# Patient Record
Sex: Female | Born: 1975 | Race: White | Hispanic: No | Marital: Married | Smoking: Never smoker
Health system: Southern US, Community
[De-identification: ages and names within clinical notes are randomized; demographics above are authoritative.]

## PROBLEM LIST (undated history)

## (undated) DIAGNOSIS — F32A Depression, unspecified: Secondary | ICD-10-CM

## (undated) DIAGNOSIS — F419 Anxiety disorder, unspecified: Secondary | ICD-10-CM

## (undated) HISTORY — PX: TYMPANOSTOMY TUBE PLACEMENT: SHX32

## (undated) HISTORY — PX: BREAST BIOPSY: SHX20

## (undated) HISTORY — DX: Depression, unspecified: F32.A

## (undated) HISTORY — DX: Anxiety disorder, unspecified: F41.9

---

## 2003-03-24 ENCOUNTER — Other Ambulatory Visit: Admission: RE | Admit: 2003-03-24 | Discharge: 2003-03-24 | Payer: Self-pay | Admitting: Family Medicine

## 2010-01-18 ENCOUNTER — Other Ambulatory Visit: Admission: RE | Admit: 2010-01-18 | Discharge: 2010-01-18 | Payer: Self-pay | Admitting: Obstetrics and Gynecology

## 2010-01-25 ENCOUNTER — Encounter: Admission: RE | Admit: 2010-01-25 | Discharge: 2010-01-25 | Payer: Self-pay | Admitting: Obstetrics and Gynecology

## 2010-07-06 ENCOUNTER — Encounter: Admission: RE | Admit: 2010-07-06 | Discharge: 2010-07-06 | Payer: Self-pay | Admitting: Obstetrics and Gynecology

## 2010-08-04 ENCOUNTER — Ambulatory Visit (HOSPITAL_COMMUNITY): Admission: RE | Admit: 2010-08-04 | Discharge: 2010-08-04 | Payer: Self-pay | Admitting: Family Medicine

## 2017-11-28 HISTORY — PX: BREAST BIOPSY: SHX20

## 2018-01-12 ENCOUNTER — Other Ambulatory Visit (HOSPITAL_COMMUNITY): Payer: Self-pay | Admitting: *Deleted

## 2018-01-12 DIAGNOSIS — N644 Mastodynia: Secondary | ICD-10-CM

## 2018-02-01 ENCOUNTER — Ambulatory Visit
Admission: RE | Admit: 2018-02-01 | Discharge: 2018-02-01 | Disposition: A | Discharge status: Home / Self Care | Payer: No Typology Code available for payment source | Source: Ambulatory Visit | Attending: Obstetrics and Gynecology | Admitting: Obstetrics and Gynecology

## 2018-02-01 ENCOUNTER — Other Ambulatory Visit (HOSPITAL_COMMUNITY): Payer: Self-pay | Admitting: Obstetrics and Gynecology

## 2018-02-01 ENCOUNTER — Ambulatory Visit (HOSPITAL_COMMUNITY)
Admission: RE | Admit: 2018-02-01 | Discharge: 2018-02-01 | Disposition: A | Discharge status: Home / Self Care | Payer: Self-pay | Source: Ambulatory Visit | Attending: Obstetrics and Gynecology | Admitting: Obstetrics and Gynecology

## 2018-02-01 ENCOUNTER — Encounter (HOSPITAL_COMMUNITY): Payer: Self-pay

## 2018-02-01 VITALS — BP 118/76 | Ht 67.0 in

## 2018-02-01 DIAGNOSIS — N644 Mastodynia: Secondary | ICD-10-CM

## 2018-02-01 DIAGNOSIS — Z01419 Encounter for gynecological examination (general) (routine) without abnormal findings: Secondary | ICD-10-CM

## 2018-02-01 DIAGNOSIS — N6310 Unspecified lump in the right breast, unspecified quadrant: Secondary | ICD-10-CM

## 2018-02-01 NOTE — Progress Notes (Signed)
Complaints of right outer breast pain since September 2018 that comes and goes. Patient rates the pain at a 4 out of 10.  Pap Smear: Pap smear completed today. Last Pap smear was in 2010 and normal per patient. Per patient has no history of an abnormal Pap smear. No Pap smear results are in Epic.  Physical exam: Breasts Breasts symmetrical. No skin abnormalities bilateral breasts. No nipple retraction bilateral breasts. No nipple discharge bilateral breasts. No lymphadenopathy. No lumps palpated bilateral breasts. Complaints of bilateral outer breast tenderness on exam that was greater within the left breast. [facility]. Appointment scheduled for Thursday, February 01, 2018 at 0850.        Pelvic/Bimanual   Ext Genitalia No lesions, no swelling and no discharge observed on external genitalia.         Vagina Vagina pink and normal texture. No lesions or discharge observed in vagina.          Cervix Cervix is present. Cervix pink and beefy red around cervical os. Cervix friable. Small amount of blood observed at cervical os. Patient stated it is about the time for her to start her menstrual cycle.     Uterus Uterus is present and palpable. Uterus in normal position and normal size.        Adnexae Bilateral ovaries present and palpable. No tenderness on palpation.         Rectovaginal No rectal exam completed today since patient had no rectal complaints. No skin abnormalities observed on exam.    Smoking History: Patient has never smoked.  Patient Navigation: Patient education provided. Access to services provided for patient through [facility] program.   Breast and Cervical Cancer Risk Assessment: Patient has a family history of her mother diagnosed with breast cancer. Patient has no known genetic mutations or history of radiation treatment to the chest before age 42. Patient has no history of cervical dysplasia,  immunocompromised, or DES exposure in-utero.

## 2018-02-01 NOTE — Patient Instructions (Addendum)
Explained breast self awareness with Christina Vaughn Ayuso. Let patient know [facility] will cover Pap smears and HPV typing every 5 years unless has a history of abnormal Pap smears. [facility]. Appointment scheduled for Thursday, February 01, 2018 at 0850. Let patient know will follow up with her within the next couple weeks with results of Pap smear by letter or phone. Christina Vaughn Fedele verbalized understanding.  Christina Vaughn, Kathaleen Maserhristine Poll, RN 9:38 AM

## 2018-02-02 ENCOUNTER — Encounter (HOSPITAL_COMMUNITY): Payer: Self-pay | Admitting: *Deleted

## 2018-02-02 LAB — CYTOLOGY - PAP
Diagnosis: NEGATIVE
HPV (WINDOPATH): NOT DETECTED

## 2018-02-05 ENCOUNTER — Ambulatory Visit
Admission: RE | Admit: 2018-02-05 | Discharge: 2018-02-05 | Disposition: A | Discharge status: Home / Self Care | Payer: No Typology Code available for payment source | Source: Ambulatory Visit | Attending: Obstetrics and Gynecology | Admitting: Obstetrics and Gynecology

## 2018-02-05 ENCOUNTER — Other Ambulatory Visit (HOSPITAL_COMMUNITY): Payer: Self-pay | Admitting: Obstetrics and Gynecology

## 2018-02-05 DIAGNOSIS — N6310 Unspecified lump in the right breast, unspecified quadrant: Secondary | ICD-10-CM

## 2018-02-12 ENCOUNTER — Ambulatory Visit: Payer: Self-pay | Admitting: Surgery

## 2018-02-12 DIAGNOSIS — N6310 Unspecified lump in the right breast, unspecified quadrant: Secondary | ICD-10-CM

## 2018-02-12 NOTE — H&P (Signed)
Christina Vaughn Nurse Documented: 02/12/2018 10:21 AM Location: [facility] Patient #: [id number] DOB: 25-Sep-1976 Married / Language: [place] / Race: White Female  History of Present Illness Christina Fus A. Walida Cajas MD; 02/12/2018 10:57 AM) Patient words: The patient sent at the request of Dr. Earlene Plater for evaluation of right breast pain in right breast mass 2. Patient gives a six-month history of right breast pain in the upper quadrant. The pain is related to her cycle and is more short before menses. The pain is become more constant and is described as dull and achy with radiation to her right shoulder. Nothing makes it better or worse and is dependent upon her menstrual cycle she states. The patient denies any masses that she can feel and denies right nipple discharge. Left breast otherwise normal.                    42 year old female presenting for evaluation of diffuse lateral right breast pain which extends into the region of her shoulder.The patient has family history of breast cancer in her mother.  EXAM: DIGITAL DIAGNOSTIC BILATERAL MAMMOGRAM WITH CAD AND TOMO  RIGHT BREAST ULTRASOUND  COMPARISON: Previous exam(s).  ACR Breast Density Category d: The breast tissue is extremely dense, which lowers the sensitivity of mammography.  FINDINGS: In the central right breast, middle depth there is an obscured oval mass measuring approximately 1.2 cm. There is a group of amorphous calcifications in the superior left breast at approximately 12 o'clock, middle depth. They may possibly layer on the true lateral view, however in comparison with her 2011 mammogram there were clearly calcifications in this location which appear to have decreased in size and number from the comparison exam.  Mammographic images were processed with CAD.  Physical exam of the upper-outer quadrant of the right breast demonstrates a subtle firm lump adjacent to the nipple.  Ultrasound of  the right breast at 9:30, 1 cm from the nipple demonstrates 2 adjacent masses. One of these masses which is slightly more distant from the nipple appears bilobed, is hypoechoic with predominantly circumscribed partially indistinct margins. This mass measures 1.6 x 0.5 x 1.2 cm. There is a smaller slightly more heterogeneous nodule with some indistinct margins measuring 0.9 x 0.5 x 0.6 cm.  Ultrasound of the right axilla demonstrates multiple normal-appearing lymph nodes.  IMPRESSION: 1. There are 2 indeterminate masses in the right breast at 9:30.  2. No mammographic evidence of left breast malignancy.  RECOMMENDATION: Ultrasound-guided biopsy is recommended for the 2 masses in the right breast at 9:30. This has been scheduled for 02/05/2018 at 7:30 a.m.  I have discussed the findings and recommendations with the patient. Results were also provided in writing at the conclusion of the visit. If applicable, a reminder letter will be sent to the patient regarding the next appointment.  BI-RADS CATEGORY 4: Suspicious.   Electronically Signed By: Frederico Hamman M.D. On: 02/01/2018 11:47     Diagnosis 1. 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 Breast, right, needle core biopsy, 9:30 o'clock - FIBROCYSTIC CHANGES INCLUDING APOCRINE METAPLASIA - PSEUDOANGIOMATOUS STROMAL HYPERPLASIA - NO MALIGNANCY IDENTIFIED Microscopic Comment 1. These results were called to [facility] on February 06, 2018. Manning Charity MD Pathologist, Electronic Signature (Case signed 02/06/2018) Specimen Gross and Clinical Information Specimen Comment 1. In formalin 8:05, extracted 30 sec; two right breast masses 2. In formalin 8:15, extracted 30 sec; two right breast  masses Specimen(s) Obtained: 1. Breast, right, needle core biopsy, 9:30 o'clock 2. Breast, right, needle core  biopsy, 9:30 o'clock Specimen Clinical.  The patient is a 42 year old female.   Past Surgical History Marcelino Duster(Michelle R. Brooks, CMA; 02/12/2018 10:22 AM) Breast Biopsy Right. Oral Surgery  Diagnostic Studies History Marcelino Duster(Michelle R. Shon BatonBrooks, CMA; 02/12/2018 10:22 AM) Colonoscopy never Mammogram within last year Pap Smear 1-5 years ago  Allergies Marcelino Duster(Michelle R. Brooks, CMA; 02/12/2018 10:22 AM) No Known Drug Allergies [02/12/2018]:  Medication History Marcelino Duster(Michelle R. Brooks, CMA; 02/12/2018 10:22 AM) No Current Medications Medications Reconciled  Social History Marcelino Duster(Michelle R. Brooks, CMA; 02/12/2018 10:22 AM) Alcohol use Occasional alcohol use. Caffeine use Tea. No drug use Tobacco use Never smoker.  Family History Marcelino Duster(Michelle R. Shon BatonBrooks, CMA; 02/12/2018 10:22 AM) Breast Cancer Mother. Depression Mother. Thyroid problems Mother.  Pregnancy / Birth History Marcelino Duster(Michelle R. Shon BatonBrooks, CMA; 02/12/2018 10:22 AM) Age at menarche 11 years. Gravida 1 Length (months) of breastfeeding >24 Maternal age 43-35 Para 1 Regular periods  Other Problems Marcelino Duster(Michelle R. Brooks, CMA; 02/12/2018 10:22 AM) Lump In Breast     Review of Systems (Valeri Sula A. Monea Pesantez MD; 02/12/2018 11:01 AM) General Not Present- Appetite Loss, Chills, Fatigue, Fever, Night Sweats, Weight Gain and Weight Loss. Skin Present- Dryness. Not Present- Change in Wart/Mole, Hives, Jaundice, New Lesions, Non-Healing Wounds, Rash and Ulcer. HEENT Present- Hearing Loss, Ringing in the Ears and Sinus Pain. Not Present- Earache, Hoarseness, Nose Bleed, Oral Ulcers, Seasonal Allergies, Sore Throat, Visual Disturbances, Wears glasses/contact lenses and Yellow Eyes. Respiratory Present- Chronic Cough. Not Present- Bloody sputum, Difficulty Breathing, Snoring and Wheezing. Breast Present- Breast Mass and Breast Pain. Not Present- Nipple Discharge and Skin Changes. Gastrointestinal Not Present- Abdominal Pain, Bloating, Bloody Stool, Change in  Bowel Habits, Chronic diarrhea, Constipation, Difficulty Swallowing, Excessive gas, Gets full quickly at meals, Hemorrhoids, Indigestion, Nausea, Rectal Pain and Vomiting. Female Genitourinary Not Present- Frequency, Nocturia, Painful Urination, Pelvic Pain and Urgency. Musculoskeletal Not Present- Back Pain, Joint Pain, Joint Stiffness, Muscle Pain, Muscle Weakness and Swelling of Extremities. Neurological Not Present- Decreased Memory, Fainting, Headaches, Numbness, Seizures, Tingling, Tremor, Trouble walking and Weakness. Psychiatric Not Present- Anxiety, Bipolar, Change in Sleep Pattern, Depression, Fearful and Frequent crying. Endocrine Present- Cold Intolerance. Not Present- Excessive Hunger, Hair Changes, Heat Intolerance, Hot flashes and New Diabetes. Hematology Not Present- Blood Thinners, Easy Bruising, Excessive bleeding, Gland problems, HIV and Persistent Infections. All other systems negative  [employer](Michelle R. Brooks CMA; 02/12/2018 10:22 AM) 02/12/2018 10:21 AM Weight: 123.25 lb Height: 67in Body Surface Area: 1.65 m Body Mass Index: 19.3 kg/m  BP: 110/66 (Sitting, Left Arm, Standard)      Physical Exam (Cage Gupton A. Rion Schnitzer MD; 02/12/2018 10:57 AM)  General Mental Status-Alert. General Appearance-Consistent with stated age. Hydration-Well hydrated. Voice-Normal.  Head and Neck Head-normocephalic, atraumatic with no lesions or palpable masses. Trachea-midline. Thyroid Gland Characteristics - normal size and consistency.  Eye Eyeball - Bilateral-Extraocular movements intact. Sclera/Conjunctiva - Bilateral-No scleral icterus.  Breast Note: Bruising right breast upper outer quadrant. No masses. Left breast is normal. No evidence of nipple discharge bilaterally.  Cardiovascular Cardiovascular examination reveals -normal heart sounds, regular rate and rhythm with no murmurs and normal pedal pulses bilaterally.  Neurologic Neurologic  evaluation reveals -alert and oriented x 3 with no impairment of recent or remote memory. Mental Status-Normal.  Musculoskeletal Normal Exam - Left-Upper Extremity Strength Normal and Lower Extremity Strength Normal. Normal Exam - Right-Upper Extremity Strength Normal and Lower Extremity Strength Normal.  Lymphatic Head &  Neck  General Head & Neck Lymphatics: Bilateral - Description - Normal. Axillary  General Axillary Region: Bilateral - Description - Normal. Tenderness - Non Tender.    Assessment & Plan (Krysten Veronica A. Jazz Biddy MD; 02/12/2018 10:58 AM)  MASS OF RIGHT BREAST ON MAMMOGRAM (N63.10) Impression: Complex sclerosing lesion noted. Fibrocystic change noted. Described right breast lumpectomy for complex sclerosing lesion and the possibility of some malignancy. This is small less than 10% but most recommendations are to excise complex or skin lesions which have discussed with the patient today. She will think this over. If she opts out of excision, I recommended 6 month follow-up diagnostic mammography and possibly ultrasound. Risk of lumpectomy include bleeding, infection, seroma, more surgery, use of seed/wire, wound care, cosmetic deformity and the need for other treatments, death , blood clots, death. Pt agrees to proceed.  Current Plans The anatomy and the physiology was discussed. The pathophysiology and natural history of the disease was discussed. Options were discussed and recommendations were made. Technique, risks, benefits, & alternatives were discussed. Risks such as stroke, heart attack, bleeding, indection, death, and other risks discussed. Questions answered. The patient agrees to proceed.  PAINFUL LUMPY RIGHT BREAST (N64.4)  Current Plans Pt Education - [facility] Breast Biopsy HCI: discussed with patient and provided information. Pt Education - [facility] Breast Pains Education

## 2018-03-21 ENCOUNTER — Encounter (HOSPITAL_COMMUNITY): Payer: Self-pay | Admitting: *Deleted

## 2018-03-21 NOTE — Progress Notes (Signed)
Letter mailed to patient with negative pap smear results. HPV was negative. Next pap smear due in five years. 

## 2019-07-12 ENCOUNTER — Other Ambulatory Visit: Payer: Self-pay | Admitting: Surgery

## 2019-07-12 DIAGNOSIS — Z1231 Encounter for screening mammogram for malignant neoplasm of breast: Secondary | ICD-10-CM

## 2019-08-23 ENCOUNTER — Ambulatory Visit
Admission: RE | Admit: 2019-08-23 | Discharge: 2019-08-23 | Disposition: A | Discharge status: Home / Self Care | Payer: BC Managed Care – PPO | Source: Ambulatory Visit | Attending: Surgery | Admitting: Surgery

## 2019-08-23 ENCOUNTER — Other Ambulatory Visit: Payer: Self-pay

## 2019-08-23 DIAGNOSIS — Z1231 Encounter for screening mammogram for malignant neoplasm of breast: Secondary | ICD-10-CM

## 2019-10-17 ENCOUNTER — Other Ambulatory Visit: Payer: Self-pay

## 2019-10-17 ENCOUNTER — Ambulatory Visit (HOSPITAL_COMMUNITY)
Admission: EM | Admit: 2019-10-17 | Discharge: 2019-10-17 | Disposition: A | Discharge status: Home / Self Care | Payer: BC Managed Care – PPO | Attending: Family Medicine | Admitting: Family Medicine

## 2019-10-17 ENCOUNTER — Encounter (HOSPITAL_COMMUNITY): Payer: Self-pay | Admitting: Emergency Medicine

## 2019-10-17 DIAGNOSIS — J0110 Acute frontal sinusitis, unspecified: Secondary | ICD-10-CM

## 2019-10-17 LAB — POC SARS CORONAVIRUS 2 AG
SARS Coronavirus 2 Ag: NEGATIVE
SARS Coronavirus 2 Ag: NEGATIVE

## 2019-10-17 LAB — POC SARS CORONAVIRUS 2 AG -  ED: SARS Coronavirus 2 Ag: NEGATIVE

## 2019-10-17 NOTE — ED Triage Notes (Signed)
Mild headache today.

## 2019-10-17 NOTE — Discharge Instructions (Addendum)
Your rapid Covid test was negative.  This is most likely sinus related. You can try the Flonase nasal spray or use your herbs as you have been. Follow up as needed for continued or worsening symptoms

## 2019-10-17 NOTE — ED Provider Notes (Signed)
[facility]    CSN: [id number] Arrival date & time: 10/17/19  4944      History   Chief Complaint Chief Complaint  Patient presents with  . Headache  . Nasal Congestion    HPI Christina Vaughn is a 43 y.o. female.   Patient is an otherwise healthy 43 year old female.  She presents today with bilateral ear aching, frontal headache for the past couple days.  Felt that her symptoms were getting better until yesterday she had more severe headache and nausea.  Some sinus congestion.  Here with concerns for Covid.  She has been using natural herbal remedies for symptoms which have somewhat helped.  No fever, chills, body aches, night sweats.  No recent traveling or known sick contacts.  ROS per HPI      History reviewed. No pertinent past medical history.  There are no active problems to display for this patient.   Past Surgical History:  Procedure Laterality Date  . BREAST BIOPSY Right   . TYMPANOSTOMY TUBE PLACEMENT      OB History    Gravida  1   Para      Term      Preterm      AB      Living  1     SAB      TAB      Ectopic      Multiple      Live Births  1            Home Medications    Prior to Admission medications   Not on File    Family History Family History  Problem Relation Age of Onset  . Diabetes Mother   . Breast cancer Mother 68  . Diabetes Maternal Grandmother   . Hypertension Maternal Grandmother     Social History Social History   Tobacco Use  . Smoking status: Never Smoker  . Smokeless tobacco: Never Used  Substance Use Topics  . Alcohol use: Yes    Comment: rarely  . Drug use: No     Allergies   Patient has no known allergies.   Review of Systems Review of Systems   Physical Exam Triage Vital Signs ED Triage Vitals  Enc Vitals Group     BP 10/17/19 0912 122/84     Pulse Rate 10/17/19 0912 79     Resp 10/17/19 0912 16     Temp 10/17/19 0912 98.3 F (36.8 C)     Temp Source  10/17/19 0912 Oral     SpO2 10/17/19 0912 100 %     Weight --      Height --      Head Circumference --      Peak Flow --      Pain Score 10/17/19 0909 1     Pain Loc --      Pain Edu? --      Excl. in [place]? --    No data found.  Updated Vital Signs BP 122/84   Pulse 79   Temp 98.3 F (36.8 C) (Oral)   Resp 16   SpO2 100%   Visual Acuity Right Eye Distance:   Left Eye Distance:   Bilateral Distance:    Right Eye Near:   Left Eye Near:    Bilateral Near:     Physical Exam Vitals signs and nursing note reviewed.  Constitutional:      General: She is not in acute distress.    Appearance:  Normal appearance. She is not ill-appearing, toxic-appearing or diaphoretic.  HENT:     Head: Normocephalic and atraumatic.     Right Ear: Tympanic membrane and ear canal normal.     Left Ear: Tympanic membrane and ear canal normal.     Nose: Nose normal.     Mouth/Throat:     Pharynx: Oropharynx is clear.  Eyes:     Conjunctiva/sclera: Conjunctivae normal.  Neck:     Musculoskeletal: Normal range of motion.  Cardiovascular:     Rate and Rhythm: Normal rate and regular rhythm.  Pulmonary:     Effort: Pulmonary effort is normal.     Breath sounds: Normal breath sounds.  Musculoskeletal: Normal range of motion.  Skin:    General: Skin is warm and dry.  Neurological:     Mental Status: She is alert.  Psychiatric:        Mood and Affect: Mood normal.      UC Treatments / Results  Labs (all labs ordered are listed, but only abnormal results are displayed) Labs Reviewed  SARS CORONAVIRUS 2 (TAT 6-24 HRS)  POC SARS CORONAVIRUS 2 AG  POC SARS CORONAVIRUS 2 AG    EKG   Radiology No results found.  Procedures Procedures (including critical care time)  Medications Ordered in UC Medications - No data to display  Initial Impression / Assessment and Plan / UC Course  I have reviewed the triage vital signs and the nursing notes.  Pertinent labs & imaging results that  were available during my care of the patient were reviewed by me and considered in my medical decision making (see chart for details).     43 year old female with frontal headache, sinus pressure and congestion.  Symptoms most consistent with sinusitis. Rapid Covid swab here negative and PCR test sent for testing Recommended Flonase nasal spray for symptoms.  She can also continue the herbal remedies she has been if this helps. Final Clinical Impressions(s) / UC Diagnoses   Final diagnoses:  Acute non-recurrent frontal sinusitis     Discharge Instructions     Your rapid Covid test was negative.  This is most likely sinus related. You can try the Flonase nasal spray or use your herbs as you have been. Follow up as needed for continued or worsening symptoms     ED Prescriptions    None     PDMP not reviewed this encounter.   Janace Aris, NP 10/17/19 1459

## 2019-10-17 NOTE — ED Triage Notes (Signed)
Earache and headache recently. This resolved. Severe headache returned yesterday. Nausea with headache yesterday.

## 2020-05-15 IMAGING — MG MM DIGITAL SCREENING BILAT W/ TOMO W/ CAD
8 of 14 series · 9 of 40 positions shown · non-contrast
Comparison: Previous exam(s).

CLINICAL DATA: Screening.

EXAM:
DIGITAL SCREENING BILATERAL MAMMOGRAM WITH TOMO AND CAD

[R CC synth-2D (1 of 2)]
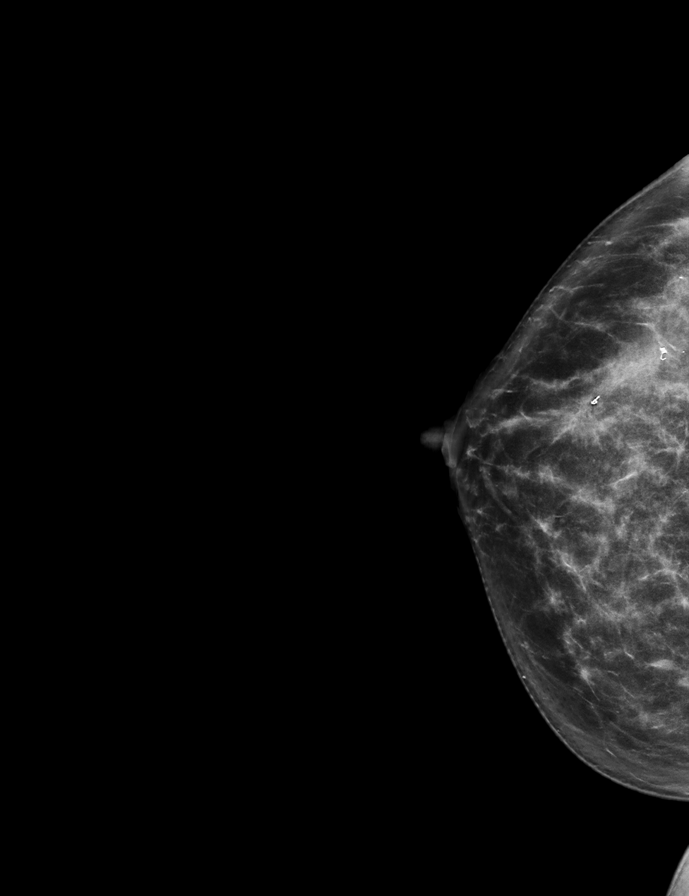

[R MLO synth-2D (1 of 2)]
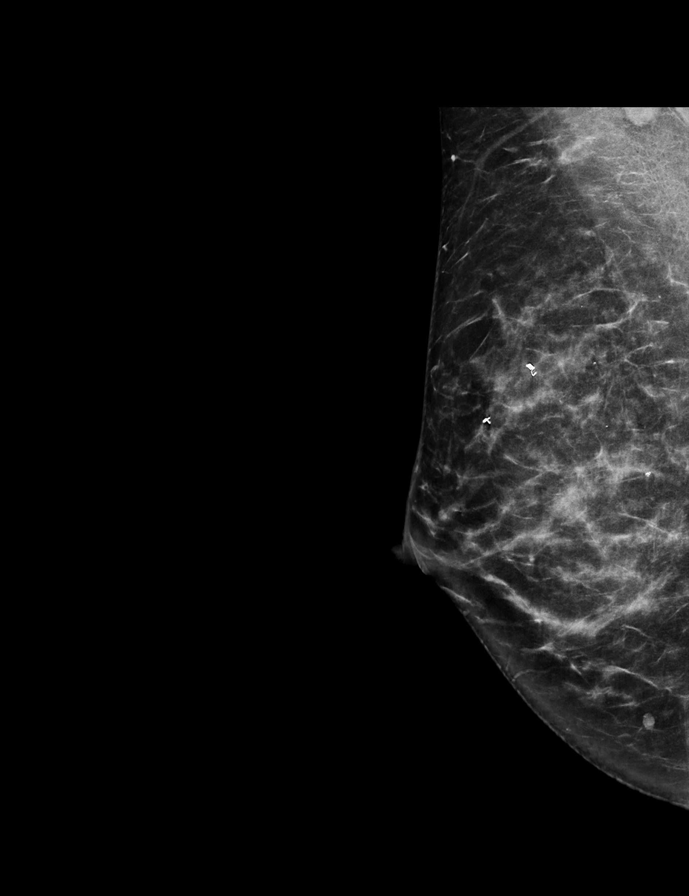

[L CC synth-2D]
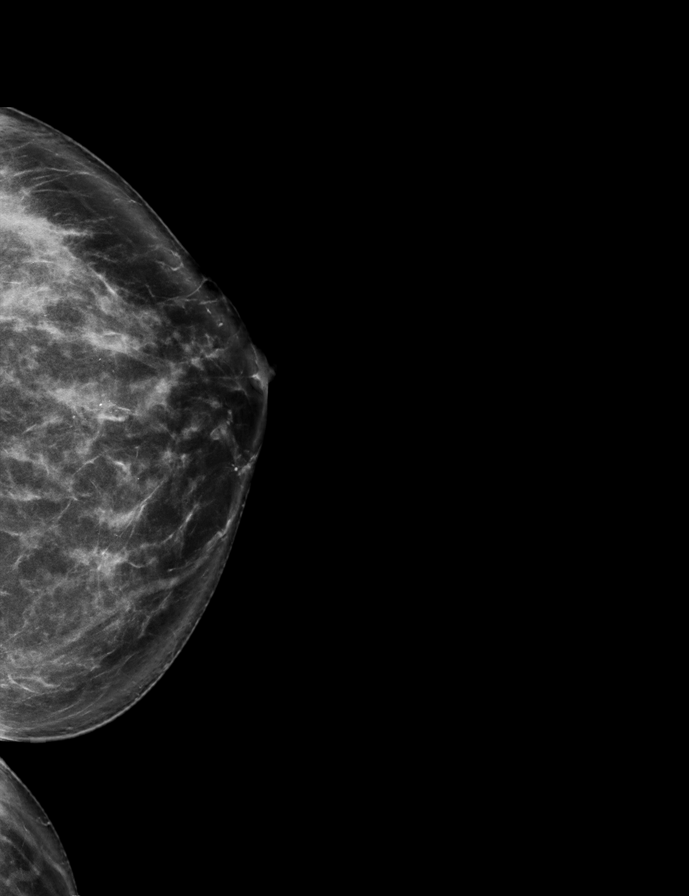

[L MLO synth-2D (1 of 2)]
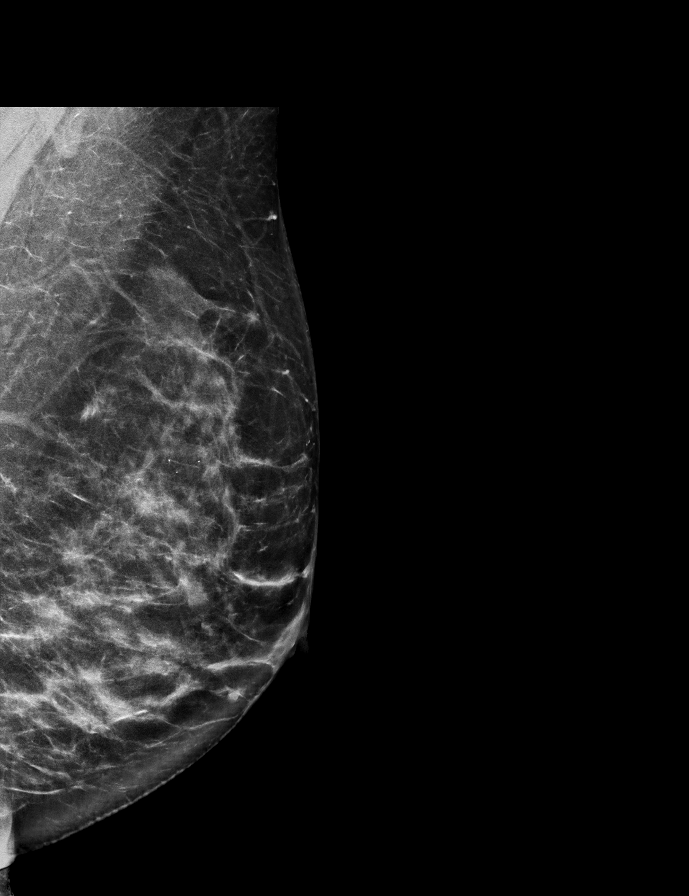

[L MLO synth-2D (2 of 2)]
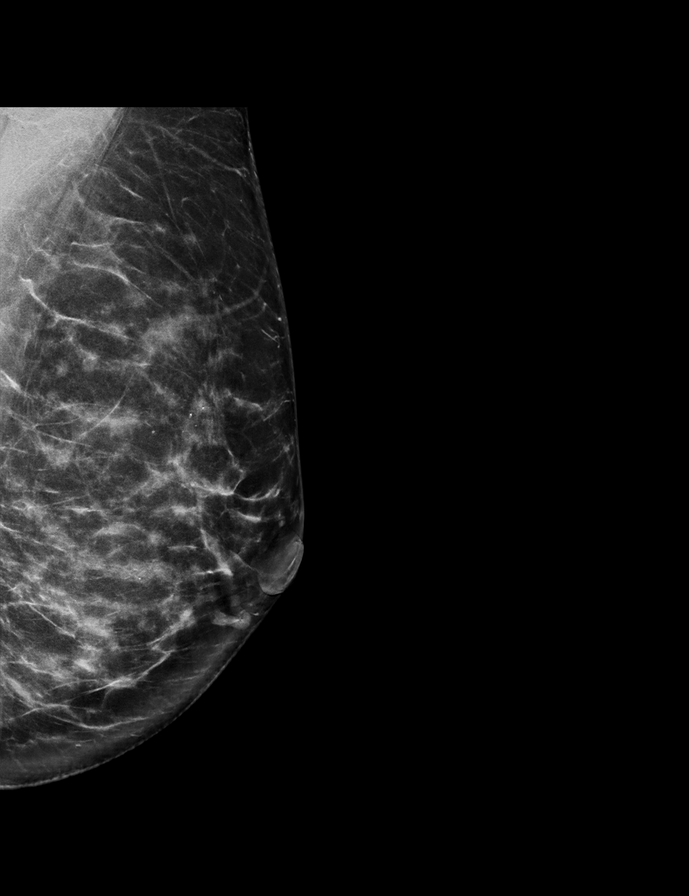

[R CC synth-2D (2 of 2)]
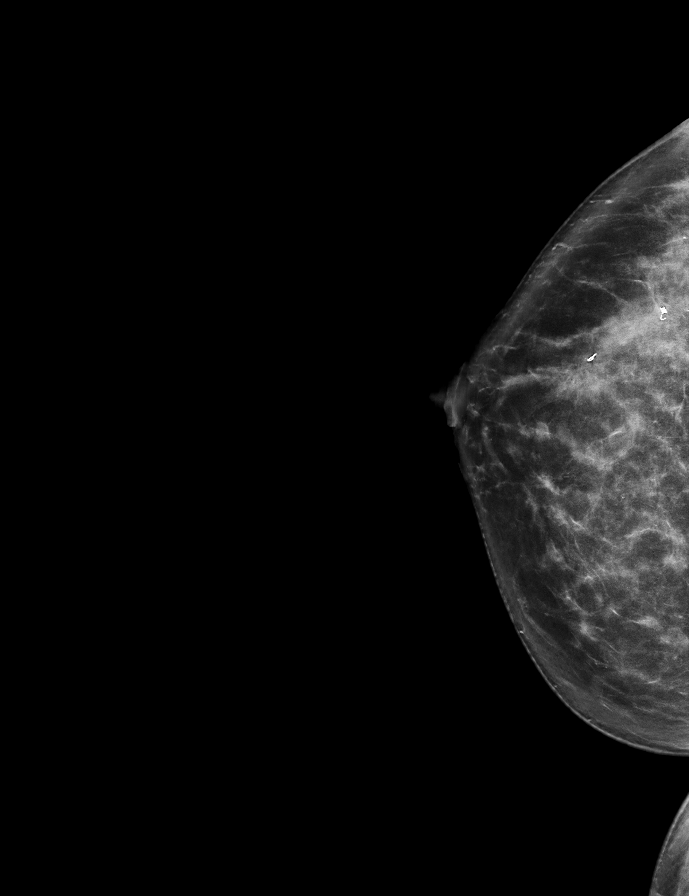

[R MLO synth-2D (2 of 2)]
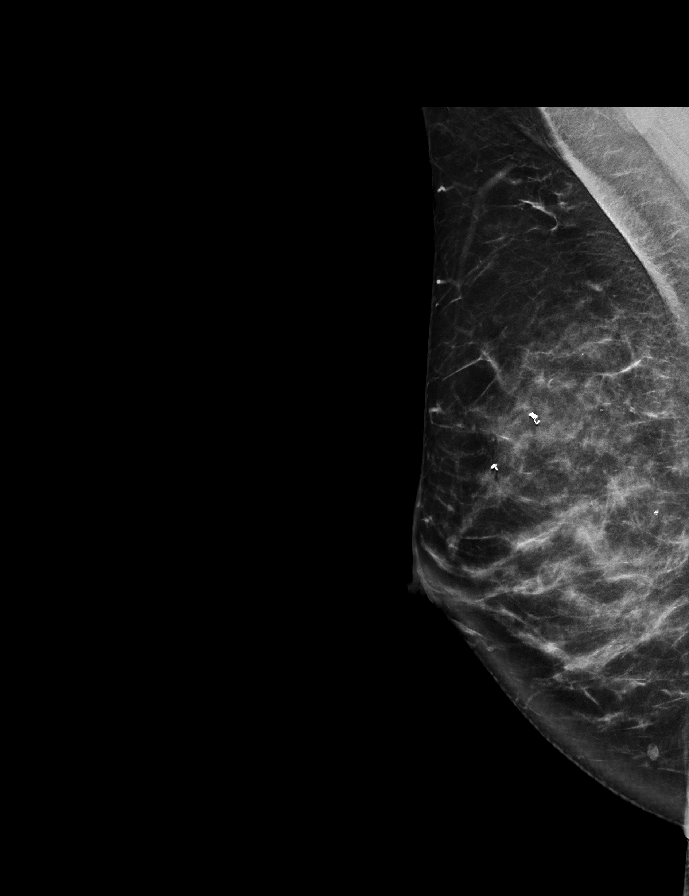

[L MLO tomo · 2 of 72 frames shown]
[frame 24/72]
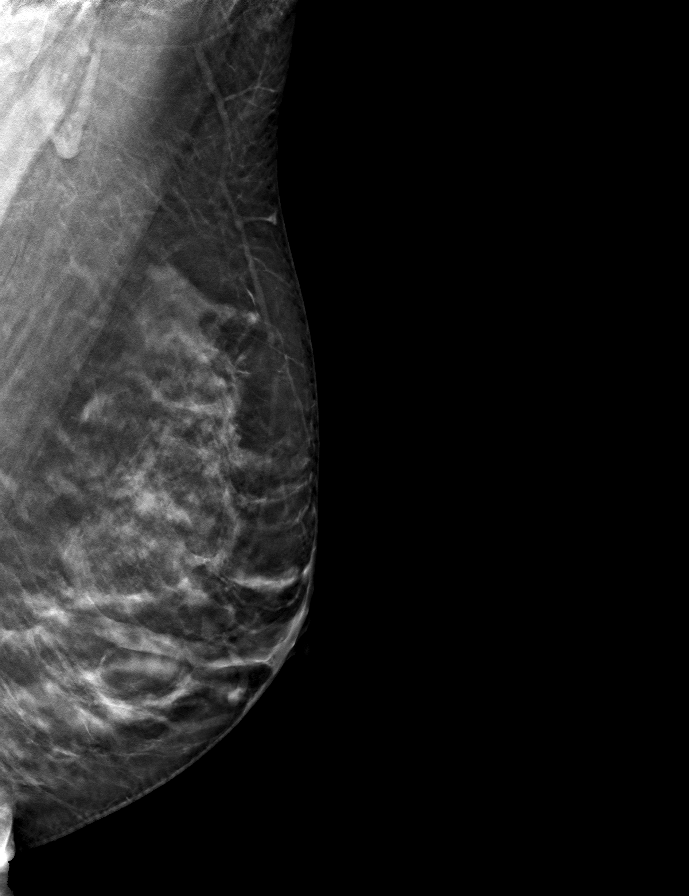
[frame 37/72]
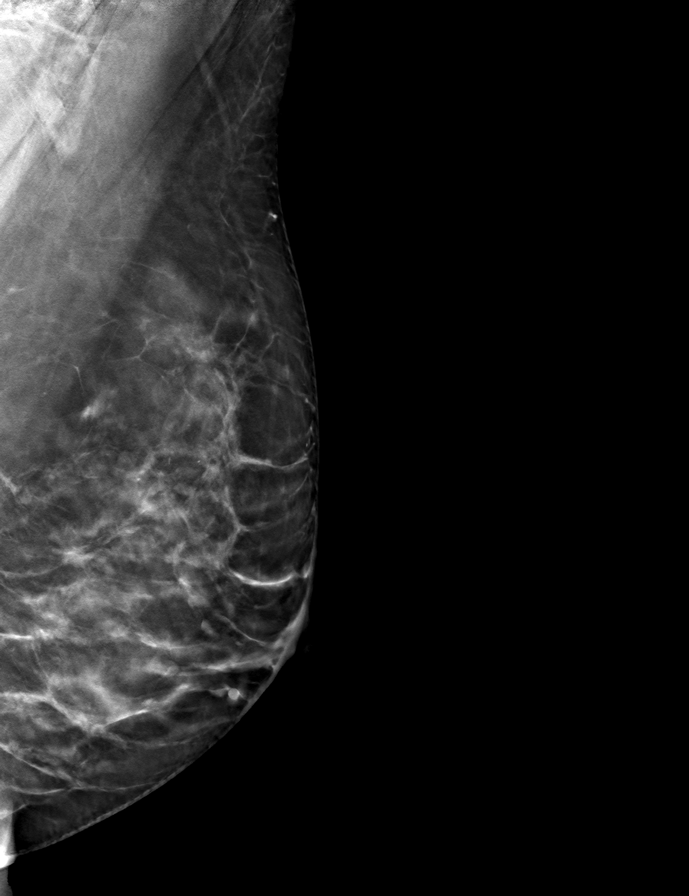

[9 of 40 positions shown; findings below may reference images not displayed]

ACR Breast Density Category c: The breast tissue is heterogeneously
dense, which may obscure small masses.
FINDINGS: There are no findings suspicious for malignancy.

Patient underwent biopsy of 2 lesions in the right breast in January 2018. One was a complex sclerosing lesion, the other fibrocystic
changes including apocrine metaplasia. Excision was recommended for
both, but has not been performed.

Images were processed with CAD.
IMPRESSION: No mammographic evidence of malignancy.

The previously biopsied benign right breast lesions, pathology
revealing a high risk lesion with excision initially recommended.
Close attention on follow-up screening mammography recommended.

A result letter of this screening mammogram will be mailed directly
to the patient.

RECOMMENDATION:
Screening mammogram in one year. (Code:Z3-3-2AR)

BI-RADS CATEGORY  2: Benign.

## 2020-11-13 ENCOUNTER — Ambulatory Visit: Payer: BC Managed Care – PPO

## 2020-11-14 ENCOUNTER — Ambulatory Visit: Payer: BC Managed Care – PPO | Attending: Internal Medicine

## 2020-11-14 DIAGNOSIS — Z23 Encounter for immunization: Secondary | ICD-10-CM

## 2020-11-14 NOTE — Progress Notes (Signed)
   Covid-[facility] Vaccination Clinic  Name:  Christina Vaughn    MRN: [id number] DOB: 1975-12-29  11/14/2020  [name] was observed post Covid-19 immunization for 15 minutes without incident. She was provided with Vaccine Information Sheet and instruction to access the V-Safe system.   [name] was instructed to call 911 with any severe reactions post vaccine: Marland Kitchen Difficulty breathing  . Swelling of face and throat  . A fast heartbeat  . A bad rash all over body  . Dizziness and weakness   Immunizations Administered    Name Date Dose VIS Date Route   Pfizer COVID-19 Vaccine 11/14/2020 10:44 AM 0.3 mL 09/16/2020 Intramuscular   Manufacturer: [employer], [employer]   Lot: [id number]   NDC: [address]-9

## 2024-06-17 ENCOUNTER — Other Ambulatory Visit: Payer: Self-pay | Admitting: Family Medicine

## 2024-06-17 ENCOUNTER — Ambulatory Visit: Admitting: Family Medicine

## 2024-06-17 ENCOUNTER — Encounter: Payer: Self-pay | Admitting: Family Medicine

## 2024-06-17 ENCOUNTER — Ambulatory Visit: Payer: Self-pay | Admitting: Family Medicine

## 2024-06-17 VITALS — BP 112/76 | HR 99 | Temp 97.6°F | Ht 67.0 in | Wt 148.0 lb

## 2024-06-17 DIAGNOSIS — Z1231 Encounter for screening mammogram for malignant neoplasm of breast: Secondary | ICD-10-CM

## 2024-06-17 DIAGNOSIS — R41840 Attention and concentration deficit: Secondary | ICD-10-CM

## 2024-06-17 DIAGNOSIS — N939 Abnormal uterine and vaginal bleeding, unspecified: Secondary | ICD-10-CM

## 2024-06-17 DIAGNOSIS — H7391 Unspecified disorder of tympanic membrane, right ear: Secondary | ICD-10-CM

## 2024-06-17 DIAGNOSIS — K625 Hemorrhage of anus and rectum: Secondary | ICD-10-CM | POA: Diagnosis not present

## 2024-06-17 DIAGNOSIS — H9191 Unspecified hearing loss, right ear: Secondary | ICD-10-CM | POA: Diagnosis not present

## 2024-06-17 DIAGNOSIS — L659 Nonscarring hair loss, unspecified: Secondary | ICD-10-CM

## 2024-06-17 DIAGNOSIS — Z818 Family history of other mental and behavioral disorders: Secondary | ICD-10-CM

## 2024-06-17 DIAGNOSIS — F419 Anxiety disorder, unspecified: Secondary | ICD-10-CM

## 2024-06-17 LAB — CBC WITH DIFFERENTIAL/PLATELET
Basophils Absolute: 0 K/uL (ref 0.0–0.1)
Basophils Relative: 0.6 % (ref 0.0–3.0)
Eosinophils Absolute: 0.1 K/uL (ref 0.0–0.7)
Eosinophils Relative: 1.8 % (ref 0.0–5.0)
HCT: 42.2 % (ref 36.0–46.0)
Hemoglobin: 14.1 g/dL (ref 12.0–15.0)
Lymphocytes Relative: 39.7 % (ref 12.0–46.0)
Lymphs Abs: 2.2 K/uL (ref 0.7–4.0)
MCHC: 33.3 g/dL (ref 30.0–36.0)
MCV: 91.5 fl (ref 78.0–100.0)
Monocytes Absolute: 0.5 K/uL (ref 0.1–1.0)
Monocytes Relative: 8.7 % (ref 3.0–12.0)
Neutro Abs: 2.8 K/uL (ref 1.4–7.7)
Neutrophils Relative %: 49.2 % (ref 43.0–77.0)
Platelets: 238 K/uL (ref 150.0–400.0)
RBC: 4.62 Mil/uL (ref 3.87–5.11)
RDW: 13.2 % (ref 11.5–15.5)
WBC: 5.6 K/uL (ref 4.0–10.5)

## 2024-06-17 LAB — COMPREHENSIVE METABOLIC PANEL WITH GFR
ALT: 9 U/L (ref 0–35)
AST: 15 U/L (ref 0–37)
Albumin: 4.5 g/dL (ref 3.5–5.2)
Alkaline Phosphatase: 52 U/L (ref 39–117)
BUN: 9 mg/dL (ref 6–23)
CO2: 26 meq/L (ref 19–32)
Calcium: 9.1 mg/dL (ref 8.4–10.5)
Chloride: 104 meq/L (ref 96–112)
Creatinine, Ser: 0.6 mg/dL (ref 0.40–1.20)
GFR: 105.69 mL/min (ref >60.00)
Glucose, Bld: 89 mg/dL (ref 70–99)
Potassium: 3.7 meq/L (ref 3.5–5.1)
Sodium: 139 meq/L (ref 135–145)
Total Bilirubin: 0.5 mg/dL (ref 0.2–1.2)
Total Protein: 6.9 g/dL (ref 6.0–8.3)

## 2024-06-17 LAB — VITAMIN B12: Vitamin B-12: 369 pg/mL (ref 211–911)

## 2024-06-17 LAB — FERRITIN: Ferritin: 22.4 ng/mL (ref 10.0–291.0)

## 2024-06-17 LAB — T4, FREE: Free T4: 0.8 ng/dL (ref 0.60–1.60)

## 2024-06-17 LAB — TSH: TSH: 3.12 u[IU]/mL (ref 0.35–5.50)

## 2024-06-17 MED ORDER — SERTRALINE HCL 25 MG PO TABS: 25.0000 mg | ORAL_TABLET | Freq: Every day | ORAL | 1 refills | Status: AC

## 2024-06-17 NOTE — Progress Notes (Unsigned)
 New Patient Office Visit  Subjective    Patient ID: Christina Vaughn, female    DOB: 28-Mar-1976  Age: 48 y.o. MRN: [id number]  CC:  Chief Complaint  Patient presents with   Establish Care    Hasn't seen pcp in a long time, always had chronic ear issues. Hearing problems and constant infections growing up.   Discuss anxeity,     HPI Christina Vaughn presents to establish care No PCP   OB/GYN   Anxiety  [place] [facility] appt in August   Struggling with focus and attention   Accupunturist   PMH   Twin sister   Right ear with hearing loss and hx of ear infections.   Concerned that she may ADHD  Sister with ADHD- severe    Rectal bleeding - bright red       06/17/2024    9:12 AM  GAD 7 : Generalized Anxiety Score  Nervous, Anxious, on Edge 2  Control/stop worrying 2  Worry too much - different things 3  Trouble relaxing 3  Restless 2  Easily annoyed or irritable 1  Afraid - awful might happen 2  Total GAD 7 Score 15  Anxiety Difficulty Somewhat difficult        Outpatient Encounter Medications as of 06/17/2024  Medication Sig   sertraline  (ZOLOFT ) 25 MG tablet Take 1 tablet (25 mg total) by mouth daily.   No facility-administered encounter medications on file as of 06/17/2024.    History reviewed. No pertinent past medical history.  Past Surgical History:  Procedure Laterality Date   BREAST BIOPSY Right    TYMPANOSTOMY TUBE PLACEMENT      Family History  Problem Relation Age of Onset   Diabetes Mother    Breast cancer Mother 75   Diabetes Maternal Grandmother    Hypertension Maternal Grandmother     Social History   Socioeconomic History   Marital status: Married    Spouse name: Not on file   Number of children: Not on file   Years of education: Not on file   Highest education level: Master's degree (e.g., MA, MS, MEng, MEd, MSW, MBA)  Occupational History   Not on file  Tobacco Use   Smoking status: Never   Smokeless tobacco:  Never  Vaping Use   Vaping status: Former  Substance and Sexual Activity   Alcohol use: Yes    Comment: rarely   Drug use: No   Sexual activity: Yes  Other Topics Concern   Not on file  Social History Narrative   Not on file   Social Drivers of Health   Financial Resource Strain: Low Risk  (06/16/2024)   Overall Financial Resource Strain (CARDIA)    Difficulty of Paying Living Expenses: Not very hard  Food Insecurity: No Food Insecurity (06/16/2024)   Hunger Vital Sign    Worried About Running Out of Food in the Last Year: Never true    Ran Out of Food in the Last Year: Never true  Transportation Needs: No Transportation Needs (06/16/2024)   PRAPARE - Administrator, Civil Service (Medical): No    Lack of Transportation (Non-Medical): No  Physical Activity: Insufficiently Active (06/16/2024)   Exercise Vital Sign    Days of Exercise per Week: 2 days    Minutes of Exercise per Session: 20 min  Stress: Stress Concern Present (06/16/2024)   [employer] of Occupational Health - Occupational Stress Questionnaire    Feeling of Stress: To some extent  Social  Connections: Moderately Isolated (06/16/2024)   Social Connection and Isolation Panel    Frequency of Communication with Friends and Family: Twice a week    Frequency of Social Gatherings with Friends and Family: Once a week    Attends Religious Services: Never    Database administrator or Organizations: No    Attends Engineer, structural: Not on file    Marital Status: Married  Catering manager Violence: Not At Risk (08/23/2019)   Humiliation, Afraid, Rape, and Kick questionnaire    Fear of Current or Ex-Partner: No    Emotionally Abused: No    Physically Abused: No    Sexually Abused: No    ROS      Objective    BP 112/76 (BP Location: Left Arm, Patient Position: Sitting)   Pulse 99   Temp 97.6 F (36.4 C) (Temporal)   Ht 5' 7 (1.702 m)   Wt 148 lb (67.1 kg)   SpO2 98%   BMI 23.18  kg/m   Physical Exam  {Labs (Optional):23779}    Assessment & Plan:   Problem List Items Addressed This Visit   None Visit Diagnoses       Hearing loss of right ear, unspecified hearing loss type    -  Primary   Relevant Orders   Ambulatory referral to ENT     Family history of attention deficit hyperactivity disorder (ADHD)         Anxiety       Relevant Medications   sertraline  (ZOLOFT ) 25 MG tablet     Attention deficit         Hair loss       Relevant Orders   CBC with Differential/Platelet   Comprehensive metabolic panel with GFR   Ferritin   TSH   T4, free   Vitamin B12   Zinc     Abnormal uterine bleeding (AUB)       Relevant Orders   Ambulatory referral to Gynecology     BRBPR (bright red blood per rectum)       Relevant Orders   Ambulatory referral to Gastroenterology   CBC with Differential/Platelet   Comprehensive metabolic panel with GFR   Ferritin   TSH   T4, free     Tm (tympanic membrane disorder), right       Relevant Orders   Ambulatory referral to ENT       Return in about 6 weeks (around 07/29/2024) for Fasting follow up.   Boby Mackintosh, NP-C

## 2024-06-22 LAB — ZINC: Zinc: 62 ug/dL (ref 60–130)

## 2024-07-01 ENCOUNTER — Ambulatory Visit
Admission: RE | Admit: 2024-07-01 | Discharge: 2024-07-01 | Disposition: A | Discharge status: Home / Self Care | Source: Ambulatory Visit

## 2024-07-01 DIAGNOSIS — Z1231 Encounter for screening mammogram for malignant neoplasm of breast: Secondary | ICD-10-CM | POA: Diagnosis not present

## 2024-07-04 ENCOUNTER — Encounter (INDEPENDENT_AMBULATORY_CARE_PROVIDER_SITE_OTHER): Payer: Self-pay

## 2024-07-05 ENCOUNTER — Other Ambulatory Visit: Payer: Self-pay | Admitting: Family Medicine

## 2024-07-05 DIAGNOSIS — R928 Other abnormal and inconclusive findings on diagnostic imaging of breast: Secondary | ICD-10-CM

## 2024-07-26 ENCOUNTER — Ambulatory Visit
Admission: RE | Admit: 2024-07-26 | Discharge: 2024-07-26 | Disposition: A | Discharge status: Home / Self Care | Source: Ambulatory Visit | Attending: Family Medicine | Admitting: Family Medicine

## 2024-07-26 DIAGNOSIS — R928 Other abnormal and inconclusive findings on diagnostic imaging of breast: Secondary | ICD-10-CM

## 2024-07-26 DIAGNOSIS — N6311 Unspecified lump in the right breast, upper outer quadrant: Secondary | ICD-10-CM | POA: Diagnosis not present

## 2024-07-26 DIAGNOSIS — N6489 Other specified disorders of breast: Secondary | ICD-10-CM | POA: Diagnosis not present

## 2024-07-30 ENCOUNTER — Ambulatory Visit: Payer: Self-pay | Admitting: Family Medicine

## 2024-07-31 ENCOUNTER — Other Ambulatory Visit: Payer: Self-pay | Admitting: Family Medicine

## 2024-07-31 DIAGNOSIS — N6489 Other specified disorders of breast: Secondary | ICD-10-CM

## 2024-07-31 DIAGNOSIS — N6310 Unspecified lump in the right breast, unspecified quadrant: Secondary | ICD-10-CM

## 2024-08-05 ENCOUNTER — Other Ambulatory Visit: Payer: Self-pay | Admitting: Medical Genetics

## 2024-08-12 ENCOUNTER — Ambulatory Visit
Admission: RE | Admit: 2024-08-12 | Discharge: 2024-08-12 | Disposition: A | Discharge status: Home / Self Care | Source: Ambulatory Visit | Attending: Family Medicine | Admitting: Family Medicine

## 2024-08-12 DIAGNOSIS — N6311 Unspecified lump in the right breast, upper outer quadrant: Secondary | ICD-10-CM | POA: Diagnosis not present

## 2024-08-12 DIAGNOSIS — N6310 Unspecified lump in the right breast, unspecified quadrant: Secondary | ICD-10-CM

## 2024-08-12 DIAGNOSIS — N6489 Other specified disorders of breast: Secondary | ICD-10-CM

## 2024-08-12 DIAGNOSIS — N6011 Diffuse cystic mastopathy of right breast: Secondary | ICD-10-CM | POA: Diagnosis not present

## 2024-08-12 DIAGNOSIS — R928 Other abnormal and inconclusive findings on diagnostic imaging of breast: Secondary | ICD-10-CM | POA: Diagnosis not present

## 2024-08-12 DIAGNOSIS — N6012 Diffuse cystic mastopathy of left breast: Secondary | ICD-10-CM | POA: Diagnosis not present

## 2024-08-12 HISTORY — PX: BREAST BIOPSY: SHX20

## 2024-08-13 LAB — SURGICAL PATHOLOGY

## 2024-08-19 ENCOUNTER — Other Ambulatory Visit (HOSPITAL_COMMUNITY)
Admission: RE | Admit: 2024-08-19 | Discharge: 2024-08-19 | Disposition: A | Source: Ambulatory Visit | Attending: Nurse Practitioner | Admitting: Nurse Practitioner

## 2024-08-19 ENCOUNTER — Ambulatory Visit: Payer: Self-pay | Admitting: Nurse Practitioner

## 2024-08-19 ENCOUNTER — Encounter: Payer: Self-pay | Admitting: Nurse Practitioner

## 2024-08-19 VITALS — BP 112/72 | HR 87 | Ht 66.5 in | Wt 147.0 lb

## 2024-08-19 DIAGNOSIS — N951 Menopausal and female climacteric states: Secondary | ICD-10-CM

## 2024-08-19 DIAGNOSIS — N926 Irregular menstruation, unspecified: Secondary | ICD-10-CM | POA: Diagnosis not present

## 2024-08-19 DIAGNOSIS — Z1331 Encounter for screening for depression: Secondary | ICD-10-CM | POA: Diagnosis not present

## 2024-08-19 DIAGNOSIS — Z01419 Encounter for gynecological examination (general) (routine) without abnormal findings: Secondary | ICD-10-CM | POA: Insufficient documentation

## 2024-08-19 DIAGNOSIS — Z124 Encounter for screening for malignant neoplasm of cervix: Secondary | ICD-10-CM

## 2024-08-19 MED ORDER — ESTRADIOL 0.025 MG/24HR TD PTTW: 1.0000 | MEDICATED_PATCH | TRANSDERMAL | 1 refills | Status: DC

## 2024-08-19 MED ORDER — PROGESTERONE MICRONIZED 100 MG PO CAPS: 100.0000 mg | ORAL_CAPSULE | Freq: Every day | ORAL | 1 refills | Status: DC

## 2024-08-19 NOTE — Progress Notes (Signed)
 Christina Vaughn [birth year] [id number]   History:  48 y.o. G1P1 presents as new patient to establish care. Having menses every 1-4 months. Experiencing symptoms of night sweats, hot flashes, worsening depression and anxiety, hair loss. Has personal stressors due to husband's infidelity and planning to buy acupuncture business. Negative STD screening last year, husband had syphilis. Patient plans to retest soon. Normal pap history. Had bilateral breast biopsies 08/12/2024, benign, healing well. H/O right breast complex sclerosing lesion. Normal pap history.   Gynecologic History Patient's last menstrual period was 05/09/2024. Period Duration (Days): 6-7 Period Pattern: (!) Irregular Menstrual Flow: Moderate Menstrual Control: Maxi pad (menstrual cup) Menstrual Control Change Freq (Hours): 4-5 Dysmenorrhea: (!) Mild Dysmenorrhea Symptoms: Nausea, Diarrhea, Headache Contraception/Family planning: none Sexually active: Yes  Health Maintenance Last Pap: 02/01/2018. Results were: Normal neg HPV Last mammogram: 07/01/2024. Results were: bilateral asymmetries, right -. 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 Mother diagnosed with breast cancer at age 13.   ROS:  A ROS was performed and pertinent positives and negatives are included.  Exam:  Vitals:   08/19/24 1417  BP: 112/72  Pulse: 87  SpO2: 98%  Weight: 147 lb (66.7 kg)  Height: 5' 6.5 (1.689 m)   Body mass index is 23.37 kg/m.  General appearance:  Normal Thyroid:  Symmetrical,  normal in size, without palpable masses or nodularity. Respiratory  Auscultation:  Clear without wheezing or rhonchi Cardiovascular  Auscultation:  Regular rate, without rubs, murmurs or gallops  Edema/varicosities:  Not grossly evident Abdominal  Soft,nontender, without masses, guarding or rebound.  Liver/spleen:  No organomegaly noted  Hernia:  None appreciated  Skin  Inspection:  Grossly normal Breasts: Examined lying and sitting.   Right: Without masses, retractions, nipple discharge or axillary adenopathy.   Left: Without masses, retractions, nipple discharge or axillary adenopathy. Pelvic: External genitalia:  no lesions              Urethra:  normal appearing urethra with no masses, tenderness or lesions              Bartholins and Skenes: normal                 Vagina: normal appearing vagina with normal color and discharge, no lesions              Cervix: no lesions Bimanual Exam:  Uterus:  no masses or tenderness              Adnexa: no mass, fullness, tenderness              Rectovaginal: Deferred              Anus:  normal, no lesions  Dereck Keas, CMA present as chaperone.   Assessment/Plan:  48 y.o. G1P1 to establish care.   Well female exam with routine gynecological exam - Plan: Cytology - PAP( [place]). Education provided on SBEs, importance of preventative screenings, current guidelines, high calcium diet, regular exercise, and multivitamin daily. Labs with PCP.   Cervical cancer screening -  Plan: Cytology - PAP( [place]). Normal pap history.   Perimenopausal symptoms - Plan: estradiol  (VIVELLE -DOT) 0.025 MG/24HR twice weekly, progesterone  (PROMETRIUM ) 100 MG capsule nightly. Having significant perimenopausal symptoms. Also recommend Mag L-threonate. Discussed risks and benefits of HRT.   Screening for breast cancer - Benign bilateral breast biopsies 08/12/24. Will follow up at recommended interval.   Screening for colon cancer - PCP sent GI referral for  colonoscopy. Patient plans to schedule soon.   Screening for osteoporosis - Average risk. Will plan DXA at age 48.   Return in about 4 weeks (around 09/16/2024) for Med follow up, annual in 1 year.    Christina DELENA Shutter DNP, 3:35 PM 08/19/2024

## 2024-08-20 ENCOUNTER — Ambulatory Visit: Payer: Self-pay | Admitting: Nurse Practitioner

## 2024-08-20 LAB — CYTOLOGY - PAP
Comment: NEGATIVE
Diagnosis: NEGATIVE
High risk HPV: NEGATIVE

## 2024-09-03 ENCOUNTER — Other Ambulatory Visit

## 2024-09-03 DIAGNOSIS — Z006 Encounter for examination for normal comparison and control in clinical research program: Secondary | ICD-10-CM

## 2024-09-14 LAB — GENECONNECT MOLECULAR SCREEN: Genetic Analysis Overall Interpretation: NEGATIVE

## 2024-09-19 ENCOUNTER — Encounter: Payer: Self-pay | Admitting: Family Medicine

## 2024-09-20 ENCOUNTER — Encounter: Payer: Self-pay | Admitting: Nurse Practitioner

## 2024-09-20 ENCOUNTER — Ambulatory Visit: Admitting: Nurse Practitioner

## 2024-09-20 VITALS — BP 112/66 | HR 87 | Ht 67.0 in | Wt 146.0 lb

## 2024-09-20 DIAGNOSIS — N951 Menopausal and female climacteric states: Secondary | ICD-10-CM

## 2024-09-20 MED ORDER — PROGESTERONE MICRONIZED 100 MG PO CAPS: 100.0000 mg | ORAL_CAPSULE | Freq: Every day | ORAL | 3 refills | Status: AC

## 2024-09-20 MED ORDER — ESTRADIOL 0.0375 MG/24HR TD PTTW: 1.0000 | MEDICATED_PATCH | TRANSDERMAL | 3 refills | Status: AC

## 2024-09-20 NOTE — Progress Notes (Signed)
   Acute Office Visit  Subjective:    Patient ID: Christina Vaughn, female    DOB: 1976/05/24, 48 y.o.   MRN: [id number]   HPI 48 y.o. presents today for med follow up. Started HRT 4 weeks ago for perimenopausal symptoms of night sweats, hot flashes, worsening depression and anxiety, hair loss. Doing low dose estradiol  patch and nightly Prometrium . Has noticed some improvement in symptoms. Sleeping better and less hot flashes and night sweats.   Patient's last menstrual period was 05/09/2024 (approximate). Period Duration (Days): 6 Period Pattern: (!) Irregular Menstrual Flow: Light Menstrual Control:  (menstrual cup) Dysmenorrhea: (!) Mild Dysmenorrhea Symptoms: Diarrhea, Headache  Review of Systems  Constitutional: Negative.   Endocrine: Positive for heat intolerance.  Psychiatric/Behavioral:  Positive for dysphoric mood and sleep disturbance. The patient is nervous/anxious.        Objective:    Physical Exam Constitutional:      Appearance: Normal appearance.     BP 112/66 (BP Location: Left Arm, Patient Position: Sitting, Cuff Size: Normal)   Pulse 87   Ht 5' 7 (1.702 m)   Wt 146 lb (66.2 kg)   LMP 05/09/2024 (Approximate) Comment: Last period about 4 months ago  SpO2 97%   BMI 22.87 kg/m  Wt Readings from Last 3 Encounters:  09/20/24 146 lb (66.2 kg)  08/19/24 147 lb (66.7 kg)  06/17/24 148 lb (67.1 kg)        Assessment & Plan:   Problem List Items Addressed This Visit   None Visit Diagnoses       Perimenopausal symptoms    -  Primary   Relevant Medications   estradiol  (VIVELLE -DOT) 0.0375 MG/24HR (Start on 09/23/2024)   progesterone  (PROMETRIUM ) 100 MG capsule      Plan: Increase estradiol  patch. Continue nightly Prometrium .  Return if symptoms worsen or fail to improve.    [name] DNP, 2:19 PM 09/20/2024

## 2024-10-01 ENCOUNTER — Encounter: Payer: Self-pay | Admitting: Physician Assistant

## 2024-10-02 ENCOUNTER — Telehealth: Payer: Self-pay

## 2024-10-02 NOTE — Telephone Encounter (Signed)
 Copied from CRM [id number]. Topic: General - Other >> Oct 01, 2024  5:03 PM China J wrote: Reason for CRM: The patient saw Vickie on 07/21 but can't remember if she wanted to do an office visit follow-up or if she just needed to do a follow up on lab work (or both). She is wanting clarification.  Please call the patient at  [phone] as she is aware of the next day turn around time.

## 2024-10-02 NOTE — Telephone Encounter (Signed)
 LM for pt to call and schedule FASTING follow up appointment asap, we will get labs after appt

## 2024-10-10 ENCOUNTER — Other Ambulatory Visit: Payer: Self-pay | Admitting: Nurse Practitioner

## 2024-10-10 DIAGNOSIS — N951 Menopausal and female climacteric states: Secondary | ICD-10-CM

## 2024-10-10 NOTE — Telephone Encounter (Signed)
 Med refill request:  *LYLLANA  0.025 MG/24HR Start:  09/23/24 Disp:   24  Refills:  3  Last AEX:  08/19/24 Next AEX:  08/26/25 Last MMG (if hormonal med):  N/A Refill authorized? Please Advise.

## 2024-11-04 DIAGNOSIS — F4323 Adjustment disorder with mixed anxiety and depressed mood: Secondary | ICD-10-CM | POA: Diagnosis not present

## 2024-11-14 NOTE — Progress Notes (Unsigned)
 Chief Complaint: BRBPR  HPI:    Christina Vaughn is a 48 year old female with past medical history as listed below including anxiety and depression, who was referred to me by Lendia Boby CROME, NP-C for a complaint of bright red blood per rectum.      Open/21/25 CBC, CMP, B12, ferritin and TSH all normal.  Past Medical History:  Diagnosis Date   Anxiety    Depression     Past Surgical History:  Procedure Laterality Date   BREAST BIOPSY Right 2019   BREAST BIOPSY Right 08/12/2024   US  RT BREAST BX W LOC DEV 1ST LESION IMG BX SPEC US  GUIDE 08/12/2024 [facility]   BREAST BIOPSY Left 08/12/2024   MM LT BREAST BX W LOC DEV 1ST LESION IMAGE BX SPEC STEREO GUIDE 08/12/2024 [facility]   TYMPANOSTOMY TUBE PLACEMENT      Current Outpatient Medications  Medication Sig Dispense Refill   estradiol  (VIVELLE -DOT) 0.0375 MG/24HR Place 1 patch onto the skin 2 (two) times a week. 24 patch 3   progesterone  (PROMETRIUM ) 100 MG capsule Take 1 capsule (100 mg total) by mouth daily. 90 capsule 3   sertraline  (ZOLOFT ) 25 MG tablet Take 1 tablet (25 mg total) by mouth daily. (Patient not taking: Reported on 09/20/2024) 30 tablet 1   No current facility-administered medications for this visit.    Allergies as of 11/15/2024   (No Known Allergies)    Family History  Problem Relation Age of Onset   Diabetes Mother    Breast cancer Mother 22   Diabetes Maternal Grandmother    Hypertension Maternal Grandmother     Social History   Socioeconomic History   Marital status: Married    Spouse name: Not on file   Number of children: Not on file   Years of education: Not on file   Highest education level: Master's degree (e.g., MA, MS, MEng, MEd, MSW, MBA)  Occupational History   Not on file  Tobacco Use   Smoking status: Never   Smokeless tobacco: Never  Vaping Use   Vaping status: Never Used  Substance and Sexual Activity   Alcohol use: Yes    Comment: rarely   Drug use: No    Sexual activity: Yes    Birth control/protection: None  Other Topics Concern   Not on file  Social History Narrative   Not on file   Social Drivers of Health   Tobacco Use: Low Risk (09/20/2024)   Patient History    Smoking Tobacco Use: Never    Smokeless Tobacco Use: Never    Passive Exposure: Not on file  Financial Resource Strain: Low Risk (06/16/2024)   Overall Financial Resource Strain (CARDIA)    Difficulty of Paying Living Expenses: Not very hard  Food Insecurity: No Food Insecurity (06/16/2024)   Epic    Worried About Radiation Protection Practitioner of Food in the Last Year: Never true    Ran Out of Food in the Last Year: Never true  Transportation Needs: No Transportation Needs (06/16/2024)   Epic    Lack of Transportation (Medical): No    Lack of Transportation (Non-Medical): No  Physical Activity: Insufficiently Active (06/16/2024)   Exercise Vital Sign    Days of Exercise per Week: 2 days    Minutes of Exercise per Session: 20 min  Stress: Stress Concern Present (06/16/2024)   [employer] of Occupational Health - Occupational Stress Questionnaire    Feeling of Stress: To some extent  Social Connections: Moderately Isolated (06/16/2024)  Social Advertising Account Executive    Frequency of Communication with Friends and Family: Twice a week    Frequency of Social Gatherings with Friends and Family: Once a week    Attends Religious Services: Never    Database Administrator or Organizations: No    Attends Engineer, Structural: Not on file    Marital Status: Married  Catering Manager Violence: Not on file  Depression (PHQ2-9): Low Risk (08/19/2024)   Depression (PHQ2-9)    PHQ-2 Score: 1  Alcohol Screen: Low Risk (06/16/2024)   Alcohol Screen    Last Alcohol Screening Score (AUDIT): 4  Housing: Low Risk (06/16/2024)   Epic    Unable to Pay for Housing in the Last Year: No    Number of Times Moved in the Last Year: 0    Homeless in the Last Year: No  Utilities: Not on  file  Health Literacy: Not on file    Review of Systems:    Constitutional: No weight loss, fever, chills, weakness or fatigue HEENT: Eyes: No change in vision               Ears, Nose, Throat:  No change in hearing or congestion Skin: No rash or itching Cardiovascular: No chest pain, chest pressure or palpitations   Respiratory: No SOB or cough Gastrointestinal: See HPI and otherwise negative Genitourinary: No dysuria or change in urinary frequency Neurological: No headache, dizziness or syncope Musculoskeletal: No new muscle or joint pain Hematologic: No bleeding or bruising Psychiatric: No history of depression or anxiety    Physical Exam:  Vital signs: There were no vitals taken for this visit.  Constitutional:   Pleasant Caucasian female appears to be in NAD, Well developed, Well nourished, alert and cooperative Head:  Normocephalic and atraumatic. Eyes:   PEERL, EOMI. No icterus. Conjunctiva pink. Ears:  Normal auditory acuity. Neck:  Supple Throat: Oral cavity and pharynx without inflammation, swelling or lesion.  Respiratory: Respirations even and unlabored. Lungs clear to auscultation bilaterally.   No wheezes, crackles, or rhonchi.  Cardiovascular: Normal S1, S2. No MRG. Regular rate and rhythm. No peripheral edema, cyanosis or pallor.  Gastrointestinal:  Soft, nondistended, nontender. No rebound or guarding. Normal bowel sounds. No appreciable masses or hepatomegaly. Rectal:  Not performed.  Msk:  Symmetrical without gross deformities. Without edema, no deformity or joint abnormality.  Neurologic:  Alert and  oriented x4;  grossly normal neurologically.  Skin:   Dry and intact without significant lesions or rashes. Psychiatric: Oriented to person, place and time. Demonstrates good judgement and reason without abnormal affect or behaviors.  RELEVANT LABS AND IMAGING: CBC    Component Value Date/Time   WBC 5.6 06/17/2024 1011   RBC 4.62 06/17/2024 1011   HGB 14.1  06/17/2024 1011   HCT 42.2 06/17/2024 1011   PLT 238.0 06/17/2024 1011   MCV 91.5 06/17/2024 1011   MCHC 33.3 06/17/2024 1011   RDW 13.2 06/17/2024 1011   LYMPHSABS 2.2 06/17/2024 1011   MONOABS 0.5 06/17/2024 1011   EOSABS 0.1 06/17/2024 1011   BASOSABS 0.0 06/17/2024 1011    CMP     Component Value Date/Time   NA 139 06/17/2024 1011   K 3.7 06/17/2024 1011   CL 104 06/17/2024 1011   CO2 26 06/17/2024 1011   GLUCOSE 89 06/17/2024 1011   BUN 9 06/17/2024 1011   CREATININE 0.60 06/17/2024 1011   CALCIUM 9.1 06/17/2024 1011   PROT 6.9 06/17/2024 1011  ALBUMIN 4.5 06/17/2024 1011   AST 15 06/17/2024 1011   ALT 9 06/17/2024 1011   ALKPHOS 52 06/17/2024 1011   BILITOT 0.5 06/17/2024 1011    Assessment: 1.  Bright red blood per rectum:  Plan: 1. ***     Delon Failing, PA-C  Gastroenterology 11/14/2024, 4:03 PM  Cc: Henson, Vickie L, NP-C

## 2024-11-15 ENCOUNTER — Encounter: Payer: Self-pay | Admitting: Physician Assistant

## 2024-11-15 ENCOUNTER — Ambulatory Visit: Admitting: Physician Assistant

## 2024-11-15 VITALS — BP 126/62 | HR 78 | Ht 67.0 in | Wt 144.1 lb

## 2024-11-15 DIAGNOSIS — Z01818 Encounter for other preprocedural examination: Secondary | ICD-10-CM | POA: Diagnosis not present

## 2024-11-15 DIAGNOSIS — Z1211 Encounter for screening for malignant neoplasm of colon: Secondary | ICD-10-CM

## 2024-11-15 MED ORDER — NA SULFATE-K SULFATE-MG SULF 17.5-3.13-1.6 GM/177ML PO SOLN: 1.0000 | Freq: Once | ORAL | 0 refills | Status: AC

## 2024-11-15 NOTE — Patient Instructions (Signed)

## 2024-11-18 DIAGNOSIS — F4323 Adjustment disorder with mixed anxiety and depressed mood: Secondary | ICD-10-CM | POA: Diagnosis not present

## 2024-12-27 ENCOUNTER — Encounter: Admitting: Gastroenterology

## 2025-01-17 ENCOUNTER — Encounter: Admitting: Gastroenterology

## 2025-08-26 ENCOUNTER — Ambulatory Visit: Admitting: Nurse Practitioner

## 2025-09-02 ENCOUNTER — Ambulatory Visit: Admitting: Nurse Practitioner
